# Patient Record
Sex: Male | Born: 2000
Health system: Southern US, Community
[De-identification: ages and names within clinical notes are randomized; demographics above are authoritative.]

## PROBLEM LIST (undated history)

## (undated) ENCOUNTER — Ambulatory Visit (HOSPITAL_COMMUNITY): Admission: EM | Payer: 59 | Source: Home / Self Care

## (undated) HISTORY — PX: NO PAST SURGERIES: SHX2092

---
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
## 2021-09-29 ENCOUNTER — Ambulatory Visit: Payer: Self-pay

## 2021-09-29 ENCOUNTER — Ambulatory Visit: Payer: 59

## 2021-09-29 ENCOUNTER — Other Ambulatory Visit: Payer: Self-pay

## 2021-09-29 ENCOUNTER — Ambulatory Visit
Admission: EM | Admit: 2021-09-29 | Discharge: 2021-09-29 | Disposition: A | Discharge status: Home / Self Care | Payer: 59 | Attending: Physician Assistant | Admitting: Physician Assistant

## 2021-09-29 ENCOUNTER — Ambulatory Visit (INDEPENDENT_AMBULATORY_CARE_PROVIDER_SITE_OTHER): Payer: 59

## 2021-09-29 DIAGNOSIS — Z23 Encounter for immunization: Secondary | ICD-10-CM | POA: Diagnosis not present

## 2021-09-29 DIAGNOSIS — S40852A Superficial foreign body of left upper arm, initial encounter: Secondary | ICD-10-CM

## 2021-09-29 DIAGNOSIS — L089 Local infection of the skin and subcutaneous tissue, unspecified: Secondary | ICD-10-CM | POA: Diagnosis not present

## 2021-09-29 DIAGNOSIS — S50352A Superficial foreign body of left elbow, initial encounter: Secondary | ICD-10-CM

## 2021-09-29 MED ORDER — TETANUS-DIPHTH-ACELL PERTUSSIS 5-2.5-18.5 LF-MCG/0.5 IM SUSY
0.5000 mL | PREFILLED_SYRINGE | Freq: Once | INTRAMUSCULAR | Status: AC
  Administered 2021-09-29: 0.5 mL via INTRAMUSCULAR

## 2021-09-29 MED ORDER — DOXYCYCLINE HYCLATE 100 MG PO CAPS: 100.0000 mg | ORAL_CAPSULE | Freq: Two times a day (BID) | ORAL | 0 refills | Status: DC

## 2021-09-29 NOTE — Discharge Instructions (Addendum)
Per x-ray there is a 5 mm piece of metal in your bicep over the affected area  You have been given a tetanus booster  generally metal does not cause issue and can be left in skin without complication  If you begin to have pain, reoccurring infection or limited movement please follow-up with orthopedic doctor for evaluation for removal of foreign body  You may use over-the-counter ibuprofen 800 mg every 8 hours if pain begins to occur  If swelling and redness do not improve after use of antibiotic please follow-up for reevaluation

## 2021-09-29 NOTE — ED Provider Notes (Signed)
[facility]    CSN: [id number] Arrival date & time: 09/29/21  1548      History   Chief Complaint Chief Complaint  Patient presents with   Abscess    HPI Ruben Zimmerman is a 21 y.o. male.   Patient presents with swelling, firmness and erythema to the left bicep for 1 day occurring after removal of a foreign body 5 days ago.  Endorses that he was splitting a long and a small piece of metal from the tree went into his left bicep, self removed.  Endorses that swelling has improved today.  Has not attempted treatment.  Denies numbness, tingling, fever, chills, drainage.  Range of motion intact.  No pertinent medical history.  History reviewed. No pertinent past medical history.  There are no problems to display for this patient.   Past Surgical History:  Procedure Laterality Date   NO PAST SURGERIES         Home Medications    Prior to Admission medications   Medication Sig Start Date End Date Taking? Authorizing Provider  naproxen (NAPROSYN) 375 MG tablet Take 1 tablet (375 mg total) by mouth 2 (two) times daily as needed for moderate pain. 11/14/18   Coral Spikes, DO    Family History Family History  Problem Relation Age of Onset   Thyroid disease Mother    Healthy Father     Social History Social History   Tobacco Use   Smoking status: Never    Passive exposure: Never   Smokeless tobacco: Never  Vaping Use   Vaping Use: Never used  Substance Use Topics   Alcohol use: Not Currently   Drug use: Never     Allergies   Patient has no known allergies.   Review of Systems Review of Systems  Constitutional: Negative.   Respiratory: Negative.    Musculoskeletal: Negative.   Skin:  Positive for wound. Negative for color change, pallor and rash.  Neurological: Negative.     Physical Exam Triage Vital Signs ED Triage Vitals  Enc Vitals Group     BP 09/29/21 1615 122/80     Pulse Rate 09/29/21 1615 76     Resp 09/29/21 1615 16     Temp  09/29/21 1615 98.8 F (37.1 C)     Temp Source 09/29/21 1615 Oral     SpO2 09/29/21 1615 100 %     Weight 09/29/21 1613 150 lb (68 kg)     Height --      Head Circumference --      Peak Flow --      Pain Score 09/29/21 1612 0     Pain Loc --      Pain Edu? --      Excl. in [place]? --    No data found.  Updated Vital Signs BP 122/80 (BP Location: Right Arm)    Pulse 76    Temp 98.8 F (37.1 C) (Oral)    Resp 16    Wt 150 lb (68 kg)    SpO2 100%   Visual Acuity Right Eye Distance:   Left Eye Distance:   Bilateral Distance:    Right Eye Near:   Left Eye Near:    Bilateral Near:     Physical Exam Constitutional:      Appearance: Normal appearance.  Eyes:     Extraocular Movements: Extraocular movements intact.  Pulmonary:     Effort: Pulmonary effort is normal.  Musculoskeletal:  Arms:     Comments: Moderate swelling over the anterior aspect of the left bicep, erythema and tenderness present at the insertion site, firmness noted at the site of insertion, 2+ brachial pulse, range of motion intact  Neurological:     Mental Status: He is alert and oriented to person, place, and time. Mental status is at baseline.  Psychiatric:        Mood and Affect: Mood normal.        Behavior: Behavior normal.     UC Treatments / Results  Labs (all labs ordered are listed, but only abnormal results are displayed) Labs Reviewed - No data to display  EKG   Radiology DG Elbow 2 Views Left  Result Date: 09/29/2021 CLINICAL DATA:  Evaluate for foreign body. EXAM: LEFT ELBOW - 2 VIEW COMPARISON:  None. FINDINGS: 5 mm metallic structure in the anterior soft tissues near the lower aspect of the biceps muscle. This foreign body appears to be in the superficial soft tissues approximately 5 mm from the skin surface. The left elbow is located without a fracture and there is no evidence for a joint effusion. Alignment of the elbow is normal. IMPRESSION: 5 mm metallic foreign body in the  anterior superficial soft tissues of the left arm, near the lower biceps region. Electronically Signed   By: Markus Daft M.D.   On: 09/29/2021 16:45    Procedures Procedures (including critical care time)  Medications Ordered in UC Medications - No data to display  Initial Impression / Assessment and Plan / UC Course  I have reviewed the triage vital signs and the nursing notes.  Pertinent labs & imaging results that were available during my care of the patient were reviewed by me and considered in my medical decision making (see chart for details).  Foreign body of left of her arm with infection  5 mm foreign body confirmed by x-ray, will not attempt to remove in office today, may follow-up with orthopedic for further evaluation as needed, doxycycline 7-day course prescribed for infection, patient to follow-up in urgent care with orthopedics if symptoms continue to persist, tetanus booster administered Final Clinical Impressions(s) / UC Diagnoses   Final diagnoses:  None   Discharge Instructions   None    ED Prescriptions   None    PDMP not reviewed this encounter.   Hans Eden, [place] 09/29/21 [id number]
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
## 2021-11-24 ENCOUNTER — Ambulatory Visit (INDEPENDENT_AMBULATORY_CARE_PROVIDER_SITE_OTHER): Payer: 59

## 2021-11-24 ENCOUNTER — Other Ambulatory Visit: Payer: Self-pay

## 2021-11-24 ENCOUNTER — Ambulatory Visit
Admission: EM | Admit: 2021-11-24 | Discharge: 2021-11-24 | Disposition: A | Discharge status: Home / Self Care | Payer: 59 | Attending: Emergency Medicine | Admitting: Emergency Medicine

## 2021-11-24 DIAGNOSIS — M25532 Pain in left wrist: Secondary | ICD-10-CM | POA: Diagnosis not present
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
## 2021-11-24 NOTE — Discharge Instructions (Addendum)
Your x-ray today did not show injury to the bone of your wrist. Your pain is most likely being caused by irritation to the soft tissues, this should improve as time progresses.  ? ?Take 800 mg of ibuprofen ( motrin)  3 times a day for the next 7 days or take 400 to 500 mg of naproxen (Aleve) twice daily for the next 7 days, this medication is to help reduce the swelling which in turn will help with your pain ? ?You may apply heat or ice, whichever makes you feel better, to affected area in 15 minute intervals ? ?You may continue activity as tolerated, there is no injury therefore, it is important that you continue to move around so you do not loose strength to the area ? ?For the first 2-3 days you may wrist brace for additional support while completing activities, once on if you begin to experience numbness or tingling it is too tight, remove and redo, you should be able to easily fit one finger under wrap  ? ?If symptoms persist past 2 weeks, you may follow up at urgent care or with orthopedic specialist for evaluation, an orthopedic doctor specializes in the bone, they may provide  management such as but not limited to imaging, long term medications and physical therapy  ?
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
## 2023-01-25 ENCOUNTER — Ambulatory Visit
Admission: EM | Admit: 2023-01-25 | Discharge: 2023-01-25 | Disposition: A | Discharge status: Home / Self Care | Payer: 59 | Attending: Emergency Medicine | Admitting: Emergency Medicine

## 2023-01-25 DIAGNOSIS — K0889 Other specified disorders of teeth and supporting structures: Secondary | ICD-10-CM | POA: Diagnosis not present

## 2023-01-25 DIAGNOSIS — R1110 Vomiting, unspecified: Secondary | ICD-10-CM | POA: Diagnosis not present

## 2023-01-25 DIAGNOSIS — R220 Localized swelling, mass and lump, head: Secondary | ICD-10-CM | POA: Diagnosis not present

## 2023-01-25 MED ORDER — ONDANSETRON 8 MG PO TBDP: 8.0000 mg | ORAL_TABLET | Freq: Three times a day (TID) | ORAL | 0 refills | Status: AC | PRN

## 2023-01-25 NOTE — ED Triage Notes (Addendum)
Pt c/o RT lower jaw pain and spreading into cheek onset Friday mid afternoon. Pt also reports emesis onset this morning x2-3 episodes.

## 2023-01-25 NOTE — Discharge Instructions (Signed)
The [facility] here and [place] can see you today at 1:30 PM.  Please use over-the-counter Tylenol and/or ibuprofen according to the package instructions as needed for pain or fever.  I have sent a prescription for Zofran to your pharmacy that you can use every 8 hours as needed for nausea.  Please keep your appointment with [facility].

## 2023-01-25 NOTE — ED Provider Notes (Signed)
[facility]    CSN: [id number] Arrival date & time: 01/25/23  0801      History   Chief Complaint Chief Complaint  Patient presents with   Jaw Pain   Emesis    HPI Ruben Zimmerman is a 22 y.o. male.   HPI  22 year old male with no significant past medical history presents for evaluation of swelling to his right lower jaw with tenderness that started 3 days ago.  He denies any fever or chills but he does have an elevated temp in clinic of 99.4.  He did have several episodes of emesis this morning.  He states that none of his teeth hurt he just feels the pressure.  No drainage.  History reviewed. No pertinent past medical history.  There are no problems to display for this patient.   Past Surgical History:  Procedure Laterality Date   NO PAST SURGERIES         Home Medications    Prior to Admission medications   Medication Sig Start Date End Date Taking? Authorizing Provider  ondansetron (ZOFRAN-ODT) 8 MG disintegrating tablet Take 1 tablet (8 mg total) by mouth every 8 (eight) hours as needed for nausea or vomiting. 01/25/23  Yes Becky Augusta, NP    Family History Family History  Problem Relation Age of Onset   Thyroid disease Mother    Healthy Father     Social History Social History   Tobacco Use   Smoking status: Never    Passive exposure: Never   Smokeless tobacco: Never  Vaping Use   Vaping Use: Never used  Substance Use Topics   Alcohol use: Not Currently   Drug use: Never     Allergies   Patient has no known allergies.   Review of Systems Review of Systems  Constitutional:  Negative for chills and fever.  HENT:  Positive for dental problem and facial swelling.      Physical Exam Triage Vital Signs ED Triage Vitals  Enc Vitals Group     BP      Pulse      Resp      Temp      Temp src      SpO2      Weight      Height      Head Circumference      Peak Flow      Pain Score      Pain Loc      Pain Edu?       Excl. in GC?    No data found.  Updated Vital Signs BP 138/83 (BP Location: Right Arm)   Pulse 87   Temp 99.4 F (37.4 C) (Oral)   Ht 5\' 11"  (1.803 m)   Wt 150 lb (68 kg)   SpO2 98%   BMI 20.92 kg/m   Visual Acuity Right Eye Distance:   Left Eye Distance:   Bilateral Distance:    Right Eye Near:   Left Eye Near:    Bilateral Near:     Physical Exam Vitals and nursing note reviewed.  Constitutional:      Appearance: Normal appearance. He is not ill-appearing.  HENT:     Head: Normocephalic and atraumatic.     Mouth/Throat:     Mouth: Mucous membranes are moist.     Pharynx: Oropharynx is clear. No oropharyngeal exudate.     Comments: Patient has swelling along the right mandible that is tender to palpation.  No induration or  fluctuance.  Also no overlying erythema.  Patient does have dental repairs to his first and second molar on the right lower side.  He does have an erupting wisdom tooth as well.  The gum tissue on the buccal and lingual sides have white streaking but no erythema.  He does have some dental decay at the gumline of his right lower canine.  None of the teeth are tender to percussion.  No drainage appreciated. Musculoskeletal:     Cervical back: Normal range of motion and neck supple. No tenderness.  Lymphadenopathy:     Cervical: No cervical adenopathy.  Skin:    General: Skin is warm and dry.     Capillary Refill: Capillary refill takes less than 2 seconds.     Findings: No erythema or rash.  Neurological:     General: No focal deficit present.     Mental Status: He is alert and oriented to person, place, and time.      UC Treatments / Results  Labs (all labs ordered are listed, but only abnormal results are displayed) Labs Reviewed - No data to display  EKG   Radiology No results found.  Procedures Procedures (including critical care time)  Medications Ordered in UC Medications - No data to display  Initial Impression / Assessment  and Plan / UC Course  I have reviewed the triage vital signs and the nursing notes.  Pertinent labs & imaging results that were available during my care of the patient were reviewed by me and considered in my medical decision making (see chart for details).   Patient is a nontoxic-appearing 22 year old male presenting for evaluation of right facial pain and swelling as outlined in HPI above.      Patient seen images above there is white streaking along the gumline but no significant erythema or drainage noted.  Some dental decay is present but none of the teeth are tender to percussion.  He does have swelling along the right mandible which is tender to touch but free of induration or fluctuance.  There is also no submandibular or submental fullness.  The patient is a patient of [facility] and I feel he would better be served to be evaluated in their office.  I have called in [facility] here in [place] have spoken to [place] the receptionist to inquire about seeing the patient today.  [facility] can see him at 1:30 this afternoon.  I will discharge patient home with some Zofran to help with nausea and have him follow-up with [facility] as scheduled.   Final Clinical Impressions(s) / UC Diagnoses   Final diagnoses:  Pain, dental  Right facial swelling  Vomiting, unspecified vomiting type, unspecified whether nausea present     Discharge Instructions      The [facility] office here and [place] can see you today at 1:30 PM.  Please use over-the-counter Tylenol and/or ibuprofen according to the package instructions as needed for pain or fever.  I have sent a prescription for Zofran to your pharmacy that you can use every 8 hours as needed for nausea.  Please keep your appointment with [facility].     ED Prescriptions     Medication Sig Dispense Auth. Provider   ondansetron (ZOFRAN-ODT) 8 MG disintegrating tablet Take 1 tablet (8 mg total) by mouth every 8 (eight) hours as  needed for nausea or vomiting. 20 tablet Becky Augusta, NP      I have reviewed the PDMP during this encounter.  Becky Augusta, NP 01/25/23 [phone]
